# Patient Record
Sex: Female | Born: 2000 | Race: Black or African American | Hispanic: No | Marital: Single | Smoking: Never smoker
Health system: Southern US, Community
[De-identification: ages and names within clinical notes are randomized; demographics above are authoritative.]

---
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
## 2021-01-18 ENCOUNTER — Other Ambulatory Visit: Payer: BC Managed Care – PPO

## 2021-01-18 DIAGNOSIS — Z20822 Contact with and (suspected) exposure to covid-19: Secondary | ICD-10-CM

## 2021-01-19 LAB — SARS-COV-2, NAA 2 DAY TAT

## 2021-01-19 LAB — NOVEL CORONAVIRUS, NAA: SARS-CoV-2, NAA: NOT DETECTED

## 2022-06-08 ENCOUNTER — Ambulatory Visit
Admission: EM | Admit: 2022-06-08 | Discharge: 2022-06-08 | Disposition: A | Discharge status: Home / Self Care | Payer: BC Managed Care – PPO | Attending: Emergency Medicine | Admitting: Emergency Medicine

## 2022-06-08 DIAGNOSIS — R21 Rash and other nonspecific skin eruption: Secondary | ICD-10-CM

## 2022-06-08 MED ORDER — PREDNISONE 10 MG (21) PO TBPK: ORAL_TABLET | Freq: Every day | ORAL | 0 refills | Status: AC

## 2022-06-08 NOTE — ED Triage Notes (Signed)
Pt present a rash on arms, foots and chest area. Symptoms started yesterday. Pt states the rash is itching and red.

## 2022-06-08 NOTE — ED Provider Notes (Signed)
[facility]    CSN: [id number] Arrival date & time: 06/08/22  0865      History   Chief Complaint Chief Complaint  Patient presents with   Rash    HPI Jaime Fritz is a 21 y.o. female.   Patient presents with erythematous pruritic rash beginning 1 day ago.  Initially began on her chest, noticed rash to the face, bilateral arms, bilateral legs and back this morning.  Has been scratching incessantly.  Attempted use of Benadryl 1 day ago, unsure of effectiveness as it made her drowsy.  Was swimming in a public pool 1 day prior to symptoms beginning, unsure if related.  Denies changes in soaps, lotions detergents fragrance, hair products, dietary changes, recent travel or exposure to wooded or plant areas.  No other members of household has similar symptoms.    History reviewed. No pertinent past medical history.  There are no problems to display for this patient.   History reviewed. No pertinent surgical history.  OB History   No obstetric history on file.      Home Medications    Prior to Admission medications   Medication Sig Start Date End Date Taking? Authorizing Provider  predniSONE (STERAPRED UNI-PAK 21 TAB) 10 MG (21) TBPK tablet Take by mouth daily. Take 6 tabs by mouth daily  for 2 days, then 5 tabs for 2 days, then 4 tabs for 2 days, then 3 tabs for 2 days, 2 tabs for 2 days, then 1 tab by mouth daily for 2 days 06/08/22  Yes Deashia Soule, Elita Boone, NP    Family History Family History  Problem Relation Age of Onset   Healthy Mother     Social History Social History   Tobacco Use   Smoking status: Never   Smokeless tobacco: Never     Allergies   Patient has no known allergies.   Review of Systems Review of Systems  Constitutional: Negative.   Respiratory: Negative.    Cardiovascular: Negative.   Skin:  Positive for rash. Negative for color change, pallor and wound.  Neurological: Negative.      Physical Exam Triage Vital  Signs ED Triage Vitals [06/08/22 0943]  Enc Vitals Group     BP 125/85     Pulse Rate 85     Resp 18     Temp 98.7 F (37.1 C)     Temp Source Oral     SpO2 96 %     Weight      Height      Head Circumference      Peak Flow      Pain Score 0     Pain Loc      Pain Edu?      Excl. in GC?    No data found.  Updated Vital Signs BP 125/85 (BP Location: Left Arm)   Pulse 85   Temp 98.7 F (37.1 C) (Oral)   Resp 18   SpO2 96%   Visual Acuity Right Eye Distance:   Left Eye Distance:   Bilateral Distance:    Right Eye Near:   Left Eye Near:    Bilateral Near:     Physical Exam Constitutional:      Appearance: Normal appearance.  HENT:     Head: Normocephalic.  Eyes:     Extraocular Movements: Extraocular movements intact.  Pulmonary:     Effort: Pulmonary effort is normal.  Skin:    Comments: Erythematous papular rash present  to the chin, chest bilateral upper and lower extremities and back, defer to photos below for reference, photos are of bilateral arms where rash is  the worse  Neurological:     Mental Status: She is alert and oriented to person, place, and time. Mental status is at baseline.  Psychiatric:        Mood and Affect: Mood normal.        Behavior: Behavior normal.         UC Treatments / Results  Labs (all labs ordered are listed, but only abnormal results are displayed) Labs Reviewed - No data to display  EKG   Radiology No results found.  Procedures Procedures (including critical care time)  Medications Ordered in UC Medications - No data to display  Initial Impression / Assessment and Plan / UC Course  I have reviewed the triage vital signs and the nursing notes.  Pertinent labs & imaging results that were available during my care of the patient were reviewed by me and considered in my medical decision making (see chart for details).  Rash  Appears to be an inflammatory process, low suspicion for infectious cause,  discussed with patient.,  Prednisone 60 mg taper prescribed for outpatient use, advised patient to stop scratching to prevent secondary infection, may continue use of Benadryl, may also try Claritin Zyrtec, topical antihistamine or calamine lotion for management of pruritus, advised patient to avoid excessive hot showers to prevent further irritation to the skin, given strict precautions that if rash continues to persist or worsen to follow-up with urgent care for reevaluation Final Clinical Impressions(s) / UC Diagnoses   Final diagnoses:  Rash     Discharge Instructions      The cause of your rash is unknown at this time however it appears to be related to an inflammatory process, very low suspicion for bacterial or viral cause at this time  Begin use of prednisone every morning with food as directed on packaging, please complete all medication, this medicine is to reduce the inflammatory process that is causing your rash  You may continue use of Benadryl, or you may take Claritin or Zyrtec daily which will not make you drowsy, may also use topical Benadryl or calamine lotion for management of itching  Do not scratch scratch, you may rub with finger pads if necessary ,as open skin will put you at risk for secondary infection  Avoid hot steamy showers as excessive heat may cause further irritation to your skin  You may follow-up with urgent care if your symptoms continue to persist or worsen even after use of medication   ED Prescriptions     Medication Sig Dispense Auth. Provider   predniSONE (STERAPRED UNI-PAK 21 TAB) 10 MG (21) TBPK tablet Take by mouth daily. Take 6 tabs by mouth daily  for 2 days, then 5 tabs for 2 days, then 4 tabs for 2 days, then 3 tabs for 2 days, 2 tabs for 2 days, then 1 tab by mouth daily for 2 days 42 tablet Jamicia Haaland, Elita Boone, NP      PDMP not reviewed this encounter.   Valinda Hoar, NP 06/08/22 1028

## 2022-06-08 NOTE — Discharge Instructions (Addendum)
The cause of your rash is unknown at this time however it appears to be related to an inflammatory process, very low suspicion for bacterial or viral cause at this time  Begin use of prednisone every morning with food as directed on packaging, please complete all medication, this medicine is to reduce the inflammatory process that is causing your rash  You may continue use of Benadryl, or you may take Claritin or Zyrtec daily which will not make you drowsy, may also use topical Benadryl or calamine lotion for management of itching  Do not scratch scratch, you may rub with finger pads if necessary ,as open skin will put you at risk for secondary infection  Avoid hot steamy showers as excessive heat may cause further irritation to your skin  You may follow-up with urgent care if your symptoms continue to persist or worsen even after use of medication
